# Patient Record
Sex: Male | Born: 1995 | Race: Black or African American | Hispanic: No | Marital: Single | Smoking: Current every day smoker
Health system: Southern US, Community
[De-identification: ages and names within clinical notes are randomized; demographics above are authoritative.]

---

## 2017-02-04 ENCOUNTER — Emergency Department (HOSPITAL_COMMUNITY)
Admission: EM | Admit: 2017-02-04 | Discharge: 2017-02-04 | Disposition: A | Discharge status: Home / Self Care | Payer: BLUE CROSS/BLUE SHIELD | Attending: Emergency Medicine | Admitting: Emergency Medicine

## 2017-02-04 ENCOUNTER — Encounter (HOSPITAL_COMMUNITY): Payer: Self-pay

## 2017-02-04 DIAGNOSIS — R112 Nausea with vomiting, unspecified: Secondary | ICD-10-CM | POA: Diagnosis not present

## 2017-02-04 DIAGNOSIS — F17210 Nicotine dependence, cigarettes, uncomplicated: Secondary | ICD-10-CM | POA: Insufficient documentation

## 2017-02-04 DIAGNOSIS — R748 Abnormal levels of other serum enzymes: Secondary | ICD-10-CM | POA: Diagnosis not present

## 2017-02-04 DIAGNOSIS — R51 Headache: Secondary | ICD-10-CM | POA: Insufficient documentation

## 2017-02-04 DIAGNOSIS — R519 Headache, unspecified: Secondary | ICD-10-CM

## 2017-02-04 LAB — HEPATIC FUNCTION PANEL
ALT: 75 U/L — AB (ref 17–63)
AST: 166 U/L — ABNORMAL HIGH (ref 15–41)
Albumin: 5.4 g/dL — ABNORMAL HIGH (ref 3.5–5.0)
Alkaline Phosphatase: 113 U/L (ref 38–126)
BILIRUBIN DIRECT: 0.2 mg/dL (ref 0.1–0.5)
BILIRUBIN INDIRECT: 1.1 mg/dL — AB (ref 0.3–0.9)
Total Bilirubin: 1.3 mg/dL — ABNORMAL HIGH (ref 0.3–1.2)
Total Protein: 9.1 g/dL — ABNORMAL HIGH (ref 6.5–8.1)

## 2017-02-04 LAB — BASIC METABOLIC PANEL
Anion gap: 15 (ref 5–15)
BUN: 28 mg/dL — AB (ref 6–20)
CALCIUM: 10.1 mg/dL (ref 8.9–10.3)
CO2: 21 mmol/L — AB (ref 22–32)
CREATININE: 1.22 mg/dL (ref 0.61–1.24)
Chloride: 99 mmol/L — ABNORMAL LOW (ref 101–111)
GFR calc Af Amer: >60 mL/min (ref >60)
GFR calc non Af Amer: >60 mL/min (ref >60)
GLUCOSE: 87 mg/dL (ref 65–99)
Potassium: 4.4 mmol/L (ref 3.5–5.1)
Sodium: 135 mmol/L (ref 135–145)

## 2017-02-04 LAB — CBC
HCT: 42.1 % (ref 39.0–52.0)
Hemoglobin: 14.3 g/dL (ref 13.0–17.0)
MCH: 28 pg (ref 26.0–34.0)
MCHC: 34 g/dL (ref 30.0–36.0)
MCV: 82.4 fL (ref 78.0–100.0)
PLATELETS: 214 10*3/uL (ref 150–400)
RBC: 5.11 MIL/uL (ref 4.22–5.81)
RDW: 11.7 % (ref 11.5–15.5)
WBC: 10.3 10*3/uL (ref 4.0–10.5)

## 2017-02-04 LAB — LIPASE, BLOOD: LIPASE: 23 U/L (ref 11–51)

## 2017-02-04 MED ORDER — DIPHENHYDRAMINE HCL 50 MG/ML IJ SOLN
25.0000 mg | Freq: Once | INTRAMUSCULAR | Status: AC
  Administered 2017-02-04: 25 mg via INTRAVENOUS
  Filled 2017-02-04: qty 1

## 2017-02-04 MED ORDER — METOCLOPRAMIDE HCL 5 MG/ML IJ SOLN
10.0000 mg | Freq: Once | INTRAMUSCULAR | Status: AC
  Administered 2017-02-04: 10 mg via INTRAVENOUS
  Filled 2017-02-04: qty 2

## 2017-02-04 MED ORDER — SODIUM CHLORIDE 0.9 % IV BOLUS (SEPSIS)
1000.0000 mL | Freq: Once | INTRAVENOUS | Status: AC
  Administered 2017-02-04: 1000 mL via INTRAVENOUS

## 2017-02-04 MED ORDER — ONDANSETRON 4 MG PO TBDP: 4.0000 mg | ORAL_TABLET | Freq: Three times a day (TID) | ORAL | 0 refills | Status: AC | PRN

## 2017-02-04 NOTE — ED Triage Notes (Signed)
Pt states he has a pain in his temples X2 days. Pt reports last night he had some nausea and vomiting. Pt concerned he could be dehydrated. AOX4, skin warm and dry.

## 2017-02-04 NOTE — ED Provider Notes (Signed)
[facility]-EMERGENCY DEPT Provider Note   CSN: [id number] Arrival date & time: 02/04/17  1016     History   Chief Complaint Chief Complaint  Patient presents with  . Headache    HPI Johnny Gentry is a 21 y.o. male.  Johnny Gentry is a 21 y.o. Male who presents to the emergency department complaining of headache, associated vomiting since yesterday. Patient reports yesterday he began having some vomiting with associated generalized abdominal pain starting yesterday. He reports developing a gradual onset of a headache after and feeling lightheaded with position change. He reports earlier today he was still feeling lightheaded with position change. He tells me he's had nothing to eat or drink today because he feels he'll be nauseated if he eats. He denies any current abdominal pain. He has not vomited today. No diarrhea. He reports generalized frontal headache. He denies any head injury. He does admit to occasional alcohol use and THC use. No anticoagulation use. He denies any treatments at the prior to arrival. He denies fevers, numbness, tingling, weakness, cough, chest pain, shortness of breath, emesis, hematochezia, diarrhea, urinary symptoms or rashes.   The history is provided by the patient and medical records. No language interpreter was used.  Headache   Associated symptoms include nausea and vomiting. Pertinent negatives include no fever and no shortness of breath.    History reviewed. No pertinent past medical history.  There are no active problems to display for this patient.   History reviewed. No pertinent surgical history.     Home Medications    Prior to Admission medications   Medication Sig Start Date End Date Taking? Authorizing Provider  ondansetron (ZOFRAN ODT) 4 MG disintegrating tablet Take 1 tablet (4 mg total) by mouth every 8 (eight) hours as needed for nausea or vomiting. 02/04/17   Everlene Farrier, PA-C    Family History History reviewed. No pertinent  family history.  Social History Social History  Substance Use Topics  . Smoking status: Current Every Day Smoker  . Smokeless tobacco: Never Used     Comment: 1 cigarette per day  . Alcohol use No     Allergies   Amoxicillin   Review of Systems Review of Systems  Constitutional: Negative for chills and fever.  HENT: Negative for congestion and sore throat.   Eyes: Negative for pain and visual disturbance.  Respiratory: Negative for cough and shortness of breath.   Cardiovascular: Negative for chest pain.  Gastrointestinal: Positive for nausea and vomiting. Negative for abdominal pain and diarrhea.  Genitourinary: Negative for dysuria.  Musculoskeletal: Negative for back pain and neck pain.  Skin: Negative for rash.  Neurological: Positive for light-headedness and headaches. Negative for dizziness, syncope, weakness and numbness.     Physical Exam Updated Vital Signs BP 122/81 (BP Location: Right Arm)   Pulse (!) 54   Temp 98 F (36.7 C) (Oral)   Resp 16   SpO2 96%   Physical Exam  Constitutional: He is oriented to person, place, and time. He appears well-developed and well-nourished. No distress.  Nontoxic appearing.  HENT:  Head: Normocephalic and atraumatic.  Mouth/Throat: Oropharynx is clear and moist.  Eyes: Pupils are equal, round, and reactive to light. Conjunctivae and EOM are normal. Right eye exhibits no discharge. Left eye exhibits no discharge.  Neck: Neck supple. No JVD present.  Cardiovascular: Normal rate, regular rhythm, normal heart sounds and intact distal pulses.  Exam reveals no gallop and no friction rub.   No murmur heard. Pulmonary/Chest: Effort  normal and breath sounds normal. No stridor. No respiratory distress. He has no wheezes. He has no rales.  Lungs are clear to ascultation bilaterally. Symmetric chest expansion bilaterally. No increased work of breathing. No rales or rhonchi.    Abdominal: Soft. Bowel sounds are normal. He exhibits no  distension and no mass. There is no tenderness. There is no guarding.  Abdomen is soft and nontender to palpation.  Musculoskeletal: Normal range of motion. He exhibits no edema or tenderness.  Lymphadenopathy:    He has no cervical adenopathy.  Neurological: He is alert and oriented to person, place, and time. No cranial nerve deficit or sensory deficit. He exhibits normal muscle tone. Coordination normal.  Patient is alert and oriented 3. Cranial nerves are intact. Normal gait. Speech is clear and coherent. No pronator drift. EOMs are intact. Strength and sensation is intact in his bilateral upper and lower extremities.  Skin: Skin is warm and dry. Capillary refill takes less than 2 seconds. No rash noted. He is not diaphoretic. No erythema. No pallor.  Psychiatric: He has a normal mood and affect. His behavior is normal.  Nursing note and vitals reviewed.    ED Treatments / Results  Labs (all labs ordered are listed, but only abnormal results are displayed) Labs Reviewed  BASIC METABOLIC PANEL - Abnormal; Notable for the following:       Result Value   Chloride 99 (*)    CO2 21 (*)    BUN 28 (*)    All other components within normal limits  HEPATIC FUNCTION PANEL - Abnormal; Notable for the following:    Total Protein 9.1 (*)    Albumin 5.4 (*)    AST 166 (*)    ALT 75 (*)    Total Bilirubin 1.3 (*)    Indirect Bilirubin 1.1 (*)    All other components within normal limits  CBC  LIPASE, BLOOD    EKG  EKG Interpretation None       Radiology No results found.  Procedures Procedures (including critical care time)  Medications Ordered in ED Medications  sodium chloride 0.9 % bolus 1,000 mL (1,000 mLs Intravenous New Bag/Given 02/04/17 1316)  metoCLOPramide (REGLAN) injection 10 mg (10 mg Intravenous Given 02/04/17 1315)  diphenhydrAMINE (BENADRYL) injection 25 mg (25 mg Intravenous Given 02/04/17 1316)     Initial Impression / Assessment and Plan / ED Course  I  have reviewed the triage vital signs and the nursing notes.  Pertinent labs & imaging results that were available during my care of the patient were reviewed by me and considered in my medical decision making (see chart for details).     This is a 21 y.o. Male who presents to the emergency department complaining of headache, associated vomiting since yesterday. Patient reports yesterday he began having some vomiting with associated generalized abdominal pain starting yesterday. He reports developing a gradual onset of a headache after and feeling lightheaded with position change. He reports earlier today he was still feeling lightheaded with position change. He tells me he's had nothing to eat or drink today because he feels he'll be nauseated if he eats. He denies any current abdominal pain. He has not vomited today. No diarrhea. He reports generalized frontal headache. He denies any head injury. He does admit to occasional alcohol use and THC use. On exam the patient is afebrile and nontoxic appearing. His abdomen is soft and nontender to palpation. Bowel sounds are present. He has no focal neurological deficits.  We'll provide a migraine cocktail and check blood work. BMP reveals a BUN of 28 and a creatinine of 1.22. Patient reports he has been urinating normally. No difficulty urinating. CBC is within normal limits. No leukocytosis. Lipase is within normal limits. Hepatic function panel is remarkable for mildly elevated AST of 166 and an ALT of 75. Patient does admit to occasional alcohol use. I suspect this might be the cause of his mildly elevated liver enzymes. He has no right upper quadrant tenderness to palpation. After Reglan and Benadryl and fluid bolus he is feeling better and has no nausea or vomiting after tolerating by mouth. Headache has resolved. I did advised for him to discontinue alcohol use as well as Tylenol use. I advised he needs to have his liver enzymes rechecked by primary care. I  advised the patient to follow-up with their primary care provider this week. I advised the patient to return to the emergency department with new or worsening symptoms or new concerns. The patient verbalized understanding and agreement with plan.      Final Clinical Impressions(s) / ED Diagnoses   Final diagnoses:  Bad headache  Non-intractable vomiting with nausea, unspecified vomiting type  Elevated liver enzymes    New Prescriptions New Prescriptions   ONDANSETRON (ZOFRAN ODT) 4 MG DISINTEGRATING TABLET    Take 1 tablet (4 mg total) by mouth every 8 (eight) hours as needed for nausea or vomiting.     Everlene Farrier, PA-C 02/04/17 1535    Little, Ambrose Finland, MD 02/06/17 [phone]

## 2017-02-04 NOTE — Discharge Instructions (Signed)
Please avoid alcohol and Tylenol. Please have your liver enzymes rechecked by primary care as we discussed.

## 2017-02-04 NOTE — ED Notes (Signed)
Iv infiltrated . Pt states pain is better he went to sleep iv d/c

## 2017-02-04 NOTE — ED Notes (Signed)
Pt states that he has had h/a since last night and vomited last night, has not vomited today

## 2018-02-23 ENCOUNTER — Emergency Department (HOSPITAL_COMMUNITY)
Admission: EM | Admit: 2018-02-23 | Discharge: 2018-02-23 | Disposition: A | Discharge status: Home / Self Care | Payer: BLUE CROSS/BLUE SHIELD | Attending: Emergency Medicine | Admitting: Emergency Medicine

## 2018-02-23 ENCOUNTER — Other Ambulatory Visit: Payer: Self-pay

## 2018-02-23 DIAGNOSIS — J029 Acute pharyngitis, unspecified: Secondary | ICD-10-CM

## 2018-02-23 DIAGNOSIS — R599 Enlarged lymph nodes, unspecified: Secondary | ICD-10-CM | POA: Insufficient documentation

## 2018-02-23 DIAGNOSIS — F17210 Nicotine dependence, cigarettes, uncomplicated: Secondary | ICD-10-CM | POA: Diagnosis not present

## 2018-02-23 DIAGNOSIS — R591 Generalized enlarged lymph nodes: Secondary | ICD-10-CM

## 2018-02-23 MED ORDER — NAPROXEN 500 MG PO TABS: 500.0000 mg | ORAL_TABLET | Freq: Two times a day (BID) | ORAL | 0 refills | Status: DC

## 2018-02-23 MED ORDER — NAPROXEN 500 MG PO TABS: 500.0000 mg | ORAL_TABLET | Freq: Two times a day (BID) | ORAL | 0 refills | Status: AC

## 2018-02-23 NOTE — ED Provider Notes (Signed)
[name] EMERGENCY DEPARTMENT Provider Note   CSN: [id number] Arrival date & time: 02/23/18  0707     History   Chief Complaint Chief Complaint  Patient presents with  . Otalgia    HPI Johnny Gentry is a 22 y.o. male.  Patient presents with complaint of sore throat and neck swelling starting this morning.  Patient reports radiation of the pain to his right ear and a little bit of pain with swallowing, however he is able to drink water.  No difficulty breathing.  No chest pain or shortness of breath.  No fevers, nausea, vomiting, or diarrhea.  No nasal congestion.  No treatments prior to arrival. The onset of this condition was acute. The course is constant. Alleviating factors: none.       No past medical history on file.  There are no active problems to display for this patient.   No past surgical history on file.      Home Medications    Prior to Admission medications   Medication Sig Start Date End Date Taking? Authorizing Provider  naproxen (NAPROSYN) 500 MG tablet Take 1 tablet (500 mg total) by mouth 2 (two) times daily. 02/23/18   Johnny Crigler, PA-C  ondansetron (ZOFRAN ODT) 4 MG disintegrating tablet Take 1 tablet (4 mg total) by mouth every 8 (eight) hours as needed for nausea or vomiting. 02/04/17   Johnny Farrier, PA-C    Family History No family history on file.  Social History Social History   Tobacco Use  . Smoking status: Current Every Day Smoker  . Smokeless tobacco: Never Used  . Tobacco comment: 1 cigarette per day  Substance Use Topics  . Alcohol use: No  . Drug use: Yes    Types: Marijuana    Comment: daily     Allergies   Amoxicillin and Penicillins   Review of Systems Review of Systems  Constitutional: Negative for chills, fatigue and fever.  HENT: Positive for sore throat and trouble swallowing. Negative for congestion, ear pain, rhinorrhea and sinus pressure.   Eyes: Negative for redness.  Respiratory:  Negative for cough and wheezing.   Gastrointestinal: Negative for abdominal pain, diarrhea, nausea and vomiting.  Genitourinary: Negative for dysuria.  Musculoskeletal: Negative for myalgias and neck stiffness.  Skin: Negative for rash.  Neurological: Negative for headaches.  Hematological: Positive for adenopathy.     Physical Exam Updated Vital Signs BP 129/78 (BP Location: Right Arm)   Pulse 82   Temp 98.4 F (36.9 C) (Oral)   Resp 20   SpO2 99%   Physical Exam  Constitutional: He appears well-developed and well-nourished.  HENT:  Head: Normocephalic and atraumatic.  Right Ear: Tympanic membrane, external ear and ear canal normal.  Left Ear: Tympanic membrane, external ear and ear canal normal.  Nose: Nose normal. No mucosal edema or rhinorrhea.  Mouth/Throat: Uvula is midline and mucous membranes are normal. Mucous membranes are not dry. No trismus in the jaw. No uvula swelling. Posterior oropharyngeal erythema present. No oropharyngeal exudate, posterior oropharyngeal edema or tonsillar abscesses.  Eyes: Conjunctivae are normal. Right eye exhibits no discharge. Left eye exhibits no discharge.  Neck: Normal range of motion. Neck supple.  Cardiovascular: Normal rate, regular rhythm and normal heart sounds.  Pulmonary/Chest: Effort normal and breath sounds normal. No respiratory distress. He has no wheezes. He has no rales.  Abdominal: Soft. There is no tenderness.  Lymphadenopathy:       Head (right side): Tonsillar adenopathy present.  Head (left side): No tonsillar adenopathy present.    He has cervical adenopathy.       Right cervical: Superficial cervical and posterior cervical adenopathy present.       Left cervical: Superficial cervical and posterior cervical adenopathy present.  Neurological: He is alert.  Skin: Skin is warm and dry.  Psychiatric: He has a normal mood and affect.  Nursing note and vitals reviewed.    ED Treatments / Results  Labs (all  labs ordered are listed, but only abnormal results are displayed) Labs Reviewed - No data to display  EKG None  Radiology No results found.  Procedures Procedures (including critical care time)  Medications Ordered in ED Medications - No data to display   Initial Impression / Assessment and Plan / ED Course  I have reviewed the triage vital signs and the nursing notes.  Pertinent labs & imaging results that were available during my care of the patient were reviewed by me and considered in my medical decision making (see chart for details).     Patient seen and examined.   Vital signs reviewed and are as follows: BP 129/78 (BP Location: Right Arm)   Pulse 82   Temp 98.4 F (36.9 C) (Oral)   Resp 20   SpO2 99%   Plan: naproxen, conservative care.   Patient counseled on supportive care for viral URI and s/s to return including worsening symptoms, persistent fever, persistent vomiting, or if they have any other concerns. Urged to see PCP if symptoms persist for more than 3 days. Patient verbalizes understanding and agrees with plan.    Final Clinical Impressions(s) / ED Diagnoses   Final diagnoses:  Lymphadenopathy of head and neck  Sore throat   Patient with symptoms consistent with a viral syndrome.  He has lymphadenopathy causing the pain in his neck and throat.  Vitals are stable, no fever. No signs of dehydration. Lung exam normal, no signs of pneumonia. Supportive therapy indicated with return if symptoms worsen.     ED Discharge Orders         Ordered    naproxen (NAPROSYN) 500 MG tablet  2 times daily,   Status:  Discontinued     02/23/18 0852    naproxen (NAPROSYN) 500 MG tablet  2 times daily     02/23/18 0855           Johnny Crigler, PA-C 02/23/18 1610    Johnny Racer, MD 02/24/18 1039

## 2018-02-23 NOTE — ED Triage Notes (Signed)
Patient to ED c/o R ear pain and submandibular tenderness on R side. Denies fevers/chills, sore throat.

## 2018-02-23 NOTE — Discharge Instructions (Signed)
Please read and follow all provided instructions.  Your diagnoses today include:  1. Lymphadenopathy of head and neck   2. Sore throat    You appear to have an upper respiratory infection (URI). An upper respiratory tract infection, or cold, is a viral infection of the air passages leading to the lungs. It should improve gradually after 5-7 days. You may have a lingering cough that lasts for 2- 4 weeks after the infection.  Tests performed today include:  Vital signs. See below for your results today.   Medications prescribed:   Naproxen - anti-inflammatory pain medication  Do not exceed 500mg  naproxen every 12 hours, take with food  You have been prescribed an anti-inflammatory medication or NSAID. Take with food. Take smallest effective dose for the shortest duration needed for your pain. Stop taking if you experience stomach pain or vomiting.   Take any prescribed medications only as directed. Treatment for your infection is aimed at treating the symptoms. There are no medications, such as antibiotics, that will cure your infection.   Home care instructions:  Follow any educational materials contained in this packet.   Your illness is contagious and can be spread to others, especially during the first 3 or 4 days. It cannot be cured by antibiotics or other medicines. Take basic precautions such as washing your hands often, covering your mouth when you cough or sneeze, and avoiding public places where you could spread your illness to others.   Please continue drinking plenty of fluids.  Use over-the-counter medicines as needed as directed on packaging for symptom relief.  You may also use ibuprofen or tylenol as directed on packaging for pain or fever.  Do not take multiple medicines containing Tylenol or acetaminophen to avoid taking too much of this medication.  Follow-up instructions: Please follow-up with your primary care provider in the next 3 days for further evaluation of your  symptoms if you are not feeling better.   Return instructions:   Please return to the Emergency Department if you experience worsening symptoms.   RETURN IMMEDIATELY IF you develop shortness of breath, confusion or altered mental status, a new rash, become dizzy, faint, or poorly responsive, or are unable to be cared for at home.  Please return if you have persistent vomiting and cannot keep down fluids or develop a fever that is not controlled by tylenol or motrin.    Please return if you have any other emergent concerns.  Additional Information:  Your vital signs today were: BP 129/78 (BP Location: Right Arm)    Pulse 82    Temp 98.4 F (36.9 C) (Oral)    Resp 20    SpO2 99%  If your blood pressure (BP) was elevated above 135/85 this visit, please have this repeated by your doctor within one month. --------------

## 2018-04-22 ENCOUNTER — Emergency Department (HOSPITAL_COMMUNITY): Payer: BLUE CROSS/BLUE SHIELD

## 2018-04-22 ENCOUNTER — Emergency Department (HOSPITAL_COMMUNITY)
Admission: EM | Admit: 2018-04-22 | Discharge: 2018-04-22 | Disposition: A | Discharge status: Home / Self Care | Payer: BLUE CROSS/BLUE SHIELD | Attending: Emergency Medicine | Admitting: Emergency Medicine

## 2018-04-22 ENCOUNTER — Encounter (HOSPITAL_COMMUNITY): Payer: Self-pay | Admitting: Emergency Medicine

## 2018-04-22 DIAGNOSIS — R361 Hematospermia: Secondary | ICD-10-CM | POA: Diagnosis not present

## 2018-04-22 DIAGNOSIS — N451 Epididymitis: Secondary | ICD-10-CM | POA: Diagnosis not present

## 2018-04-22 DIAGNOSIS — F1210 Cannabis abuse, uncomplicated: Secondary | ICD-10-CM | POA: Insufficient documentation

## 2018-04-22 DIAGNOSIS — N5089 Other specified disorders of the male genital organs: Secondary | ICD-10-CM | POA: Diagnosis present

## 2018-04-22 DIAGNOSIS — R300 Dysuria: Secondary | ICD-10-CM | POA: Insufficient documentation

## 2018-04-22 DIAGNOSIS — F17210 Nicotine dependence, cigarettes, uncomplicated: Secondary | ICD-10-CM | POA: Insufficient documentation

## 2018-04-22 LAB — GC/CHLAMYDIA PROBE AMP (~~LOC~~) NOT AT ARMC
CHLAMYDIA, DNA PROBE: POSITIVE — AB
Neisseria Gonorrhea: NEGATIVE

## 2018-04-22 LAB — URINALYSIS, ROUTINE W REFLEX MICROSCOPIC
BILIRUBIN URINE: NEGATIVE
Glucose, UA: NEGATIVE mg/dL
Ketones, ur: 20 mg/dL — AB
Nitrite: NEGATIVE
PROTEIN: 30 mg/dL — AB
Specific Gravity, Urine: 1.025 (ref 1.005–1.030)
pH: 5 (ref 5.0–8.0)

## 2018-04-22 MED ORDER — CEFTRIAXONE SODIUM 250 MG IJ SOLR
250.0000 mg | Freq: Once | INTRAMUSCULAR | Status: AC
  Administered 2018-04-22: 250 mg via INTRAMUSCULAR
  Filled 2018-04-22: qty 250

## 2018-04-22 MED ORDER — DOXYCYCLINE HYCLATE 100 MG PO CAPS: 100.0000 mg | ORAL_CAPSULE | Freq: Two times a day (BID) | ORAL | 0 refills | Status: AC

## 2018-04-22 MED ORDER — STERILE WATER FOR INJECTION IJ SOLN
INTRAMUSCULAR | Status: AC
  Administered 2018-04-22: 10 mL
  Filled 2018-04-22: qty 10

## 2018-04-22 MED ORDER — ACETAMINOPHEN 325 MG PO TABS
650.0000 mg | ORAL_TABLET | Freq: Once | ORAL | Status: AC
  Administered 2018-04-22: 650 mg via ORAL
  Filled 2018-04-22: qty 2

## 2018-04-22 NOTE — ED Notes (Signed)
Refused to provide urine sample at this time.

## 2018-04-22 NOTE — Discharge Instructions (Signed)
I have prescribed antibiotics in order to treat your infection please take 1 tablet twice a day for the next 10 days. If you experience any worsening symptoms, penile discharge, fever, return to the ED

## 2018-04-22 NOTE — ED Triage Notes (Signed)
Pt reports testicle pain and swelling since Monday. Denies urinary symptoms.

## 2018-04-22 NOTE — ED Provider Notes (Addendum)
[name] EMERGENCY DEPARTMENT Provider Note   CSN: [id number] Arrival date & time: 04/22/18  4098     History   Chief Complaint Chief Complaint  Patient presents with  . Groin Swelling    HPI Johnny Gentry is a 22 y.o. male.  22 y./o male with no PMH presents to the ED with a chief complaint of left testicle swelling x 4 days. Patient reports the symptoms began with dysuria four days ago. He then noted blood in his semen this past week.He reports having sexual intercourse with a new partner last weekend. He denies any previous history of STIs. He denies any penile discharge, testicular pain, abdominal pain, fever, or rash.        Home Medications    Prior to Admission medications   Medication Sig Start Date End Date Taking? Authorizing Provider  doxycycline (VIBRAMYCIN) 100 MG capsule Take 1 capsule (100 mg total) by mouth 2 (two) times daily for 10 days. 04/22/18 05/02/18  Claude Manges, PA-C  naproxen (NAPROSYN) 500 MG tablet Take 1 tablet (500 mg total) by mouth 2 (two) times daily. Patient not taking: Reported on 04/22/2018 02/23/18   Renne Crigler, PA-C  ondansetron (ZOFRAN ODT) 4 MG disintegrating tablet Take 1 tablet (4 mg total) by mouth every 8 (eight) hours as needed for nausea or vomiting. Patient not taking: Reported on 04/22/2018 02/04/17   Everlene Farrier, PA-C    Family History No family history on file.  Social History Social History   Tobacco Use  . Smoking status: Current Every Day Smoker  . Smokeless tobacco: Never Used  . Tobacco comment: 1 cigarette per day  Substance Use Topics  . Alcohol use: No  . Drug use: Yes    Types: Marijuana    Comment: daily     Allergies   Amoxicillin and Penicillins   Review of Systems Review of Systems  Constitutional: Negative for chills and fever.  Gastrointestinal: Negative for abdominal pain, diarrhea, nausea and vomiting.  Genitourinary: Positive for dysuria, hematuria and scrotal  swelling. Negative for discharge, flank pain, penile pain and penile swelling.  Musculoskeletal: Negative for back pain.  Skin: Negative for pallor and wound.  Neurological: Negative for headaches.     Physical Exam Updated Vital Signs BP 139/86 (BP Location: Right Arm)   Pulse (!) 102   Temp 98.5 F (36.9 C) (Oral)   Resp 16   SpO2 95%   Physical Exam  Constitutional: He is oriented to person, place, and time. He appears well-developed and well-nourished.  HENT:  Head: Normocephalic and atraumatic.  Neck: Normal range of motion. Neck supple.  Cardiovascular: Normal heart sounds.  Pulmonary/Chest: Breath sounds normal. He has no wheezes.  Abdominal: Soft. A hernia is present.  Genitourinary: Right testis shows no mass, no swelling and no tenderness. Right testis is descended. Left testis shows swelling and tenderness. Circumcised.     Genitourinary Comments: Ttp on left testicular region, more so posterior to the area. No penile discharge, Lymph swelling, or rash noted.  Chaperoned by NT   Neurological: He is alert and oriented to person, place, and time.  Skin: Skin is warm and dry.  Nursing note and vitals reviewed.    ED Treatments / Results  Labs (all labs ordered are listed, but only abnormal results are displayed) Labs Reviewed  URINALYSIS, ROUTINE W REFLEX MICROSCOPIC - Abnormal; Notable for the following components:      Result Value   APPearance HAZY (*)    Hgb  urine dipstick SMALL (*)    Ketones, ur 20 (*)    Protein, ur 30 (*)    Leukocytes, UA MODERATE (*)    WBC, UA >50 (*)    Bacteria, UA FEW (*)    All other components within normal limits  GC/CHLAMYDIA PROBE AMP ([place]) NOT AT [facility]    EKG None  Radiology US Scrotum W/doppler  Result Date: 04/22/2018 CLINICAL DATA:  Left-sided pain and swelling EXAM: SCROTAL ULTRASOUND DOPPLER ULTRASOUND OF THE TESTICLES TECHNIQUE: Complete ultrasound examination of the testicles, epididymis, and other  scrotal structures was performed. Color and spectral Doppler ultrasound were also utilized to evaluate blood flow to the testicles. COMPARISON:  None. FINDINGS: Right testicle Measurements: 5.2 x 2.8 x 3.4 cm. No mass or microlithiasis visualized. There is mild generalized increased vascularity. Left testicle Measurements: 4.5 x 3.1 x 3.1 cm. No mass or microlithiasis visualized. There is mild generalized increased vascularity. Right epididymis:  Normal in size and appearance. Left epididymis: The tail of the epididymis is diffusely edematous and hyperemic. No epididymal mass is evident. Hydrocele: There is a minimal hydrocele on the left. No hydrocele on the right. Varicocele: There is a moderate varicocele on the left with Valsalva maneuver. No varicocele seen on the right. Pulsed Doppler interrogation of both testes demonstrates normal low resistance arterial and venous waveforms bilaterally. No appreciable scrotal wall thickening. IMPRESSION: 1. The tail of the epididymis on the left is diffusely edematous and hyperemic consistent with epididymitis. No mass evident. 2. Each testis shows slight increase in vascularity, suggesting earliest changes of orchitis bilaterally. No intratesticular mass on either side. No testicular torsion on either side. 3.  Minimal left hydrocele. 4.  Varicocele on the left demonstrated with Valsalva maneuver. Electronically Signed   By: Bretta Bang III M.D.   On: 04/22/2018 08:48    Procedures Procedures (including critical care time)  Medications Ordered in ED Medications  acetaminophen (TYLENOL) tablet 650 mg (650 mg Oral Given 04/22/18 0734)  cefTRIAXone (ROCEPHIN) injection 250 mg (250 mg Intramuscular Given 04/22/18 0944)  sterile water (preservative free) injection (10 mLs  Given 04/22/18 0946)     Initial Impression / Assessment and Plan / ED Course  I have reviewed the triage vital signs and the nursing notes.  Pertinent labs & imaging results that were  available during my care of the patient were reviewed by me and considered in my medical decision making (see chart for details).    Patient presents with left groin swelling x 4 days. He reports blood in his semen and a new sexual partner last weekend. There is significant swelling to the left testicle during examination along with tenderness to palpation. Suspicion for will order ultrasound.  Suspicion for testicular torsion patient reports his pain has gradually increased since 4 days ago.  He denies any fever or trauma to his left testicle.GC probed obtained, advised patient on follow up.  UA shows small amount of blood in his urine, moderate amount of leukocytes, Aikey blood cell count greater than 50 and few bacteria.  Ultrasound scrotum with Doppler showed 1. The tail of the epididymis on the left is diffusely edematous and  hyperemic consistent with epididymitis. No mass evident.    2. Each testis shows slight increase in vascularity, suggesting  earliest changes of orchitis bilaterally. No intratesticular mass on  either side. No testicular torsion on either side.    3. Minimal left hydrocele.    4. Varicocele on the left demonstrated with Valsalva  maneuver.     Will treat patient with IM rocephin during ED visit along with a prescription for doxycycline 100 mg twice a day for the next 10 days.  Patient is advised to return if his symptoms worsen, experiences a fever, blood in his urine.  Return precautions provided.  Final Clinical Impressions(s) / ED Diagnoses   Final diagnoses:  Epididymitis    ED Discharge Orders         Ordered    doxycycline (VIBRAMYCIN) 100 MG capsule  2 times daily     04/22/18 0938           Claude Manges, PA-C 04/22/18 1004    [address], [place], PA-C 04/22/18 1005    Blane Ohara, MD 04/24/18 0006

## 2018-04-22 NOTE — ED Notes (Signed)
Patient transported to Ultrasound 

## 2018-04-22 NOTE — ED Notes (Signed)
ED Provider at bedside. 

## 2018-04-22 NOTE — ED Notes (Signed)
Patient verbalizes understanding of discharge instructions. Opportunity for questioning and answers were provided. Armband removed by staff, pt discharged from ED ambulatory.   

## 2018-06-13 ENCOUNTER — Encounter (HOSPITAL_COMMUNITY): Payer: Self-pay | Admitting: *Deleted

## 2018-06-13 ENCOUNTER — Emergency Department (HOSPITAL_COMMUNITY)
Admission: EM | Admit: 2018-06-13 | Discharge: 2018-06-13 | Disposition: A | Discharge status: Home / Self Care | Payer: BLUE CROSS/BLUE SHIELD | Attending: Emergency Medicine | Admitting: Emergency Medicine

## 2018-06-13 DIAGNOSIS — J069 Acute upper respiratory infection, unspecified: Secondary | ICD-10-CM | POA: Insufficient documentation

## 2018-06-13 DIAGNOSIS — F17200 Nicotine dependence, unspecified, uncomplicated: Secondary | ICD-10-CM | POA: Diagnosis not present

## 2018-06-13 DIAGNOSIS — B9789 Other viral agents as the cause of diseases classified elsewhere: Secondary | ICD-10-CM | POA: Diagnosis not present

## 2018-06-13 DIAGNOSIS — R05 Cough: Secondary | ICD-10-CM | POA: Diagnosis present

## 2018-06-13 MED ORDER — GUAIFENESIN ER 1200 MG PO TB12: 1.0000 | ORAL_TABLET | Freq: Two times a day (BID) | ORAL | 1 refills | Status: AC | PRN

## 2018-06-13 MED ORDER — IBUPROFEN 600 MG PO TABS: 600.0000 mg | ORAL_TABLET | Freq: Four times a day (QID) | ORAL | 0 refills | Status: AC | PRN

## 2018-06-13 MED ORDER — BENZONATATE 100 MG PO CAPS: 100.0000 mg | ORAL_CAPSULE | Freq: Three times a day (TID) | ORAL | 0 refills | Status: AC

## 2018-06-13 NOTE — ED Provider Notes (Signed)
[name] EMERGENCY DEPARTMENT Provider Note   CSN: [id number] Arrival date & time: 06/13/18  1210     History   Chief Complaint Chief Complaint  Patient presents with  . URI    HPI Johnny Gentry is a 22 y.o. male with no significant past medical history presents emergency room today for URI symptoms.  Patient reports that yesterday he began having nasal congestion, sinus pressure, rhinorrhea, and a productive cough with sputum.  He notes that he has a sore throat only after coughing.  He denies constant sore throat.  Patient reports he is taken Tylenol Cold and flu for symptoms that improved his cough but he still has nasal congestion which is why presents today.  He reports he works for [employer] and has been around several sick contacts.  Patient also reports body aches, subjective fever and chills.  Patient denies any aggravating or relieving factors.  Patient denies any neck stiffness, rash, odontophagia, inability control secretions, chest pain, shortness of breath or hemoptysis.   HPI  History reviewed. No pertinent past medical history.  There are no active problems to display for this patient.   History reviewed. No pertinent surgical history.      Home Medications    Prior to Admission medications   Medication Sig Start Date End Date Taking? Authorizing Provider  naproxen (NAPROSYN) 500 MG tablet Take 1 tablet (500 mg total) by mouth 2 (two) times daily. Patient not taking: Reported on 04/22/2018 02/23/18   Renne Crigler, PA-C  ondansetron (ZOFRAN ODT) 4 MG disintegrating tablet Take 1 tablet (4 mg total) by mouth every 8 (eight) hours as needed for nausea or vomiting. Patient not taking: Reported on 04/22/2018 02/04/17   Everlene Farrier, PA-C    Family History History reviewed. No pertinent family history.  Social History Social History   Tobacco Use  . Smoking status: Current Every Day Smoker  . Smokeless tobacco: Never Used  . Tobacco  comment: 1 cigarette per day  Substance Use Topics  . Alcohol use: No  . Drug use: Yes    Types: Marijuana    Comment: daily     Allergies   Amoxicillin and Penicillins   Review of Systems Review of Systems  Constitutional: Positive for chills. Negative for fever.  HENT: Positive for congestion, sinus pressure and sinus pain. Negative for drooling, ear discharge, rhinorrhea and voice change.   Eyes: Negative for pain.  Respiratory: Negative for shortness of breath.   Cardiovascular: Negative for chest pain.  Gastrointestinal: Negative for nausea and vomiting.  Genitourinary: Negative for dysuria.  Musculoskeletal: Negative for neck pain and neck stiffness.  Skin: Negative for rash.  Allergic/Immunologic: Negative for immunocompromised state.  Neurological: Negative for syncope and weakness.  All other systems reviewed and are negative.    Physical Exam Updated Vital Signs BP 136/83 (BP Location: Right Arm)   Pulse (!) 55   Temp 98.8 F (37.1 C) (Oral)   Resp 14   Ht 5\' 9"  (1.753 m)   Wt 74.8 kg   SpO2 100%   BMI 24.37 kg/m   Physical Exam Vitals signs and nursing note reviewed.  Constitutional:      Appearance: He is well-developed. He is not diaphoretic.  HENT:     Head: Normocephalic and atraumatic.     Right Ear: Tympanic membrane and external ear normal.     Left Ear: Tympanic membrane and external ear normal.     Nose: Mucosal edema present.  Right Sinus: No maxillary sinus tenderness or frontal sinus tenderness.     Left Sinus: No maxillary sinus tenderness or frontal sinus tenderness.     Mouth/Throat:     Pharynx: Uvula midline.     Tonsils: No tonsillar exudate.     Comments: The patient has normal phonation and is in control of secretions. No stridor.  Midline uvula without edema. Soft palate rises symmetrically.  No tonsillar erythema or exudates. No PTA. Tongue protrusion is normal. No trismus. No creptius on neck palpation and patient has good  dentition. No gingival erythema or fluctuance noted. Mucus membranes moist.  Eyes:     General: No scleral icterus.       Right eye: No discharge.        Left eye: No discharge.     Pupils: Pupils are equal, round, and reactive to light.  Neck:     Musculoskeletal: Neck supple. Normal range of motion. No neck rigidity or spinous process tenderness.     Trachea: Trachea normal.     Comments: No nuchal rigidity or meningismus Cardiovascular:     Rate and Rhythm: Normal rate and regular rhythm.     Pulses:          Radial pulses are 2+ on the right side and 2+ on the left side.       Dorsalis pedis pulses are 2+ on the right side and 2+ on the left side.       Posterior tibial pulses are 2+ on the right side and 2+ on the left side.     Heart sounds: No murmur.     Comments: No lower extremity swelling or edema. Calves symmetric in size bilaterally. Pulmonary:     Effort: Pulmonary effort is normal.     Breath sounds: Normal breath sounds.     Comments: No increased work of breathing. No accessory muscle use. Patient is sitting upright, speaking in full sentences without difficulty  Chest:     Chest wall: No tenderness.  Abdominal:     General: Bowel sounds are normal.     Palpations: Abdomen is soft.     Tenderness: There is no abdominal tenderness. There is no guarding or rebound.  Lymphadenopathy:     Cervical: No cervical adenopathy.  Skin:    General: Skin is warm and dry.     Findings: No rash.  Neurological:     Mental Status: He is alert.      ED Treatments / Results  Labs (all labs ordered are listed, but only abnormal results are displayed) Labs Reviewed - No data to display  EKG None  Radiology No results found.  Procedures Procedures (including critical care time)  Medications Ordered in ED Medications - No data to display   Initial Impression / Assessment and Plan / ED Course  I have reviewed the triage vital signs and the nursing  notes.  Pertinent labs & imaging results that were available during my care of the patient were reviewed by me and considered in my medical decision making (see chart for details).     22 y.o. male presenting for subjective fever, chills, body aches, nasal congestion, rhinorrhea, sore throat after coughing, and productive cough.  Patient vital signs are reassuring here.  Exam is reassuring as above.  No evidence of mastoiditis or meningeal signs.  Patient's oropharyngeal exam is benign.  There is no tonsillar erythema or exudates.  He was offered a strep test but denies at this time.  Low suspicion for strep based on exam. No evidence of PTA or RPA.  Lungs are clear to auscultation bilaterally.  No indication for chest x-ray at this time.  Suspect viral illness.  Will treat with conservative therapies. I advised the patient to follow-up with pcp this week. Specific return precautions discussed. Time was given for all questions to be answered. The patient verbalized understanding and agreement with plan. The patient appears safe for discharge home.   Final Clinical Impressions(s) / ED Diagnoses   Final diagnoses:  Viral URI with cough    ED Discharge Orders         Ordered    benzonatate (TESSALON) 100 MG capsule  Every 8 hours     06/13/18 1253    Guaifenesin (MUCINEX MAXIMUM STRENGTH) 1200 MG TB12  Every 12 hours PRN     06/13/18 1253    ibuprofen (ADVIL,MOTRIN) 600 MG tablet  Every 6 hours PRN     06/13/18 1253           Princella PellegriniMaczis, Ilani Otterson M, PA-C 06/13/18 1254    Loren RacerYelverton, David, MD 06/14/18 [phone]

## 2018-06-13 NOTE — Discharge Instructions (Signed)
Please read and follow all provided instructions.  Your diagnoses today include:  1. Viral URI with cough     Tests performed today include: Vital signs. See below for your results today.  You were offered a strep test in the department.  Medications prescribed/advised:  1. Mucinex [Guaifenesin] as a decongestant [thin mucus - you have to be well hydrated when taking this for it to work],  2. Tylenol for fever/pain and Motrin/Ibuprofen for muscle aches 3. Cough Suppressant: Tessalon - take as directed.   Home care instructions:  An upper respiratory infection (URI) is also sometimes known as the common cold. Most people improve within 1 week, but symptoms can last up to 2 weeks. A residual cough may last even longer.   URI is most commonly caused by a virus. Viruses are NOT treated with antibiotics. You can easily spread the virus to others by oral contact. This includes kissing, sharing a glass, coughing, or sneezing. Touching your mouth or nose and then touching a surface, which is then touched by another person, can also spread the virus.   TREATMENT  Treatment is directed at relieving symptoms. There is no cure. Antibiotics are not effective, because the infection is caused by a virus, not by bacteria. Treatment may include:  Increased fluid intake. Sports drinks offer valuable electrolytes, sugars, and fluids.  Breathing heated mist or steam (vaporizer or shower).  Eating chicken soup or other clear broths, and maintaining good nutrition.  Getting plenty of rest.  Using gargles or lozenges for comfort.  Controlling fevers with ibuprofen or acetaminophen as directed by your caregiver.  Increasing usage of your inhaler if you have asthma.  Return to work when your temperature has returned to normal.   Follow-up instructions: Followup with your primary care doctor in 4 days if your symptoms persist.  Your more than welcome to return to the emergency department if symptoms worsen or  become concerning.  Return instructions:  Please return to the Emergency Department if you do not get better, if you get worse, or new symptoms OR  - Fever (temperature greater than 101.54F)  - Bleeding that does not stop with holding pressure to the area    -Severe pain (please note that you may be more sore the day after your accident)  - Chest Pain  - Difficulty breathing (worsening shortness of breath with sputum production may be a sign of pneumonia.   - Severe nausea or vomiting  - Inability to tolerate food and liquids  - Passing out  - Skin becoming red around your wounds  - Change in mental status (confusion or lethargy)  - New numbness or weakness     -You develop fever, swollen neck glands, pain with swallowing or Lapier areas on  the back of your throat. This may be a sign of strep throat.  Please return if you have any other emergent concerns.  Additional Information:  Your vital signs today were: BP 136/83 (BP Location: Right Arm)    Pulse (!) 55    Temp 98.8 F (37.1 C) (Oral)    Resp 14    Ht 5\' 9"  (1.753 m)    Wt 74.8 kg    SpO2 100%    BMI 24.37 kg/m  If your blood pressure (BP) was elevated above 135/85 this visit, please have this repeated by your doctor within one month.

## 2018-06-13 NOTE — ED Triage Notes (Signed)
Pt in c/o mild cough and nasal congestion that started yesterday, said he took tylenol cold and flu and it improved his cough but he is still congested

## 2018-12-22 ENCOUNTER — Emergency Department (HOSPITAL_COMMUNITY): Payer: Managed Care, Other (non HMO)

## 2018-12-22 ENCOUNTER — Encounter (HOSPITAL_COMMUNITY): Payer: Self-pay | Admitting: *Deleted

## 2018-12-22 ENCOUNTER — Other Ambulatory Visit: Payer: Self-pay

## 2018-12-22 ENCOUNTER — Emergency Department (HOSPITAL_COMMUNITY)
Admission: EM | Admit: 2018-12-22 | Discharge: 2018-12-22 | Disposition: A | Discharge status: Home / Self Care | Payer: Managed Care, Other (non HMO) | Attending: Emergency Medicine | Admitting: Emergency Medicine

## 2018-12-22 DIAGNOSIS — F17210 Nicotine dependence, cigarettes, uncomplicated: Secondary | ICD-10-CM | POA: Diagnosis not present

## 2018-12-22 DIAGNOSIS — M79604 Pain in right leg: Secondary | ICD-10-CM | POA: Insufficient documentation

## 2018-12-22 MED ORDER — ACETAMINOPHEN 325 MG PO TABS
650.0000 mg | ORAL_TABLET | Freq: Once | ORAL | Status: AC
  Administered 2018-12-22: 650 mg via ORAL
  Filled 2018-12-22: qty 2

## 2018-12-22 NOTE — ED Notes (Signed)
Pt transported to xray 

## 2018-12-22 NOTE — ED Provider Notes (Signed)
[name]   CSN: [id number] Arrival date & time: 12/22/18  2029    History   Chief Complaint Chief Complaint  Patient presents with  . Leg Pain    HPI Johnny Gentry is a 23 y.o. male with no significant past medical history who presents to the emergency department for right leg pain. Patient reports that he was doing a "butterfly" kick while he was at work yesterday. He reports that he landed wrong on his right leg and "felt my hamstring pop". He is able to ambulate but states that this worsens his pain. He denies any numbness or tingling in his right lower extremity. No medications taken today prior to arrival. He denies any other injuries. He also denies fever, symptoms of illness, or known sick contacts.      The history is provided by the patient. No language interpreter was used.    History reviewed. No pertinent past medical history.  There are no active problems to display for this patient.   History reviewed. No pertinent surgical history.      Home Medications    Prior to Admission medications   Medication Sig Start Date End Date Taking? Authorizing Provider  benzonatate (TESSALON) 100 MG capsule Take 1 capsule (100 mg total) by mouth every 8 (eight) hours. 06/13/18   Maczis, Elmer SowMichael M, PA-C  Guaifenesin (MUCINEX MAXIMUM STRENGTH) 1200 MG TB12 Take 1 tablet (1,200 mg total) by mouth every 12 (twelve) hours as needed. 06/13/18   Maczis, Elmer SowMichael M, PA-C  ibuprofen (ADVIL,MOTRIN) 600 MG tablet Take 1 tablet (600 mg total) by mouth every 6 (six) hours as needed. 06/13/18   Maczis, Elmer SowMichael M, PA-C  naproxen (NAPROSYN) 500 MG tablet Take 1 tablet (500 mg total) by mouth 2 (two) times daily. Patient not taking: Reported on 04/22/2018 02/23/18   Renne CriglerGeiple, Joshua, PA-C  ondansetron (ZOFRAN ODT) 4 MG disintegrating tablet Take 1 tablet (4 mg total) by mouth every 8 (eight) hours as needed for nausea or vomiting. Patient not  taking: Reported on 04/22/2018 02/04/17   Everlene Farrieransie, William, PA-C    Family History No family history on file.  Social History Social History   Tobacco Use  . Smoking status: Current Every Day Smoker  . Smokeless tobacco: Never Used  . Tobacco comment: 1 cigarette per day  Substance Use Topics  . Alcohol use: No  . Drug use: Yes    Types: Marijuana    Comment: daily     Allergies   Amoxicillin and Penicillins   Review of Systems Review of Systems  Musculoskeletal: Positive for gait problem (Right leg pain s/p injury.).  All other systems reviewed and are negative.    Physical Exam Updated Vital Signs BP 130/83 (BP Location: Right Arm)   Pulse (!) 56   Temp 98.4 F (36.9 C) (Oral)   Resp 16   SpO2 100%   Physical Exam Vitals signs and nursing note reviewed.  Constitutional:      General: He is not in acute distress.    Appearance: Normal appearance. He is well-developed. He is not toxic-appearing.  HENT:     Head: Normocephalic and atraumatic.     Right Ear: External ear normal.     Left Ear: External ear normal.     Nose: Nose normal.     Mouth/Throat:     Mouth: Mucous membranes are moist.     Pharynx: Oropharynx is clear. Uvula midline.  Eyes:  General: Lids are normal. No scleral icterus.    Conjunctiva/sclera: Conjunctivae normal.     Pupils: Pupils are equal, round, and reactive to light.  Neck:     Musculoskeletal: Full passive range of motion without pain, normal range of motion and neck supple.  Cardiovascular:     Rate and Rhythm: Normal rate.     Pulses: Normal pulses.     Heart sounds: Normal heart sounds. No murmur.  Pulmonary:     Effort: Pulmonary effort is normal.     Breath sounds: Normal breath sounds.  Abdominal:     General: Abdomen is flat. Bowel sounds are normal.     Palpations: Abdomen is soft.     Tenderness: There is no abdominal tenderness.  Musculoskeletal: Normal range of motion.     Right hip: Normal.     Right knee:  Normal.     Right upper leg: He exhibits tenderness.       Legs:     Comments: Right pedal pulse 2+. CR in right foot is 2 seconds x5.   Skin:    General: Skin is warm and dry.     Capillary Refill: Capillary refill takes less than 2 seconds.  Neurological:     Mental Status: He is alert and oriented to person, place, and time.      ED Treatments / Results  Labs (all labs ordered are listed, but only abnormal results are displayed) Labs Reviewed - No data to display  EKG None  Radiology Dg Femur Min 2 Views Right  Result Date: 12/22/2018 CLINICAL DATA:  Leg pain EXAM: RIGHT FEMUR 2 VIEWS COMPARISON:  None. FINDINGS: There is no evidence of fracture or other focal bone lesions. Soft tissues are unremarkable. IMPRESSION: Negative. Electronically Signed   By: Donavan Foil M.D.   On: 12/22/2018 22:38    Procedures Procedures (including critical care time)  Medications Ordered in ED Medications  acetaminophen (TYLENOL) tablet 650 mg (650 mg Oral Given 12/22/18 2206)     Initial Impression / Assessment and Plan / ED Course  I have reviewed the triage vital signs and the nursing notes.  Pertinent labs & imaging results that were available during my care of the patient were reviewed by me and considered in my medical decision making (see chart for details).        23yo male with injury to right leg that occurred yesterday after doing a "butterfly kick" at work. On exam, he is well appearing and in NAD. VSS. Right upper posterior leg with ttp. Right hip and right knee with normal exam. No swelling or deformities. He is NVI distal to his injury. Tylenol given for pain. Suspect muscular injury but will obtain x-ray to r/o fracture.  X-ray of the right femur is negative. Will recommend RICE therapy and PCP f/u as needed. Due to patient endorsing pain with ambulation, crutches provided by ortho tech. Patient was discharged home stable and in good condition.   Discussed supportive  care as well as need for f/u w/ PCP in the next 1-2 days.  Also discussed sx that warrant sooner re-evaluation in emergency department. Family / patient/ caregiver informed of clinical course, understand medical decision-making process, and agree with plan.  Final Clinical Impressions(s) / ED Diagnoses   Final diagnoses:  Right leg pain    ED Discharge Orders    None       Jean Rosenthal, NP 12/22/18 2316    Louanne Skye, MD 12/24/18 [phone]

## 2018-12-22 NOTE — ED Triage Notes (Signed)
Pt reports he was doing a butterfly kick at work yesterday and felt a pop in his hamstring. Pain since then.

## 2018-12-22 NOTE — ED Notes (Signed)
Ortho tech at bedside 

## 2019-05-02 ENCOUNTER — Emergency Department (HOSPITAL_COMMUNITY)
Admission: EM | Admit: 2019-05-02 | Discharge: 2019-05-02 | Discharge status: Against Medical Advice | Payer: Federal, State, Local not specified - PPO | Attending: Emergency Medicine | Admitting: Emergency Medicine

## 2019-05-02 ENCOUNTER — Encounter (HOSPITAL_COMMUNITY): Payer: Self-pay | Admitting: Emergency Medicine

## 2019-05-02 ENCOUNTER — Other Ambulatory Visit: Payer: Self-pay

## 2019-05-02 DIAGNOSIS — Z5321 Procedure and treatment not carried out due to patient leaving prior to being seen by health care provider: Secondary | ICD-10-CM | POA: Diagnosis not present

## 2019-05-02 DIAGNOSIS — R1011 Right upper quadrant pain: Secondary | ICD-10-CM | POA: Diagnosis present

## 2019-05-02 LAB — COMPREHENSIVE METABOLIC PANEL
ALT: 25 U/L (ref 0–44)
AST: 29 U/L (ref 15–41)
Albumin: 4.2 g/dL (ref 3.5–5.0)
Alkaline Phosphatase: 92 U/L (ref 38–126)
Anion gap: 9 (ref 5–15)
BUN: 6 mg/dL (ref 6–20)
CO2: 23 mmol/L (ref 22–32)
Calcium: 9.1 mg/dL (ref 8.9–10.3)
Chloride: 106 mmol/L (ref 98–111)
Creatinine, Ser: 0.91 mg/dL (ref 0.61–1.24)
GFR calc Af Amer: >60 mL/min (ref >60)
GFR calc non Af Amer: >60 mL/min (ref >60)
Glucose, Bld: 90 mg/dL (ref 70–99)
Potassium: 4.1 mmol/L (ref 3.5–5.1)
Sodium: 138 mmol/L (ref 135–145)
Total Bilirubin: 0.6 mg/dL (ref 0.3–1.2)
Total Protein: 6.8 g/dL (ref 6.5–8.1)

## 2019-05-02 LAB — LIPASE, BLOOD: Lipase: 21 U/L (ref 11–51)

## 2019-05-02 LAB — URINALYSIS, ROUTINE W REFLEX MICROSCOPIC
Bilirubin Urine: NEGATIVE
Glucose, UA: NEGATIVE mg/dL
Hgb urine dipstick: NEGATIVE
Ketones, ur: NEGATIVE mg/dL
Leukocytes,Ua: NEGATIVE
Nitrite: NEGATIVE
Protein, ur: NEGATIVE mg/dL
Specific Gravity, Urine: 1.008 (ref 1.005–1.030)
pH: 6 (ref 5.0–8.0)

## 2019-05-02 LAB — CBC
HCT: 39.6 % (ref 39.0–52.0)
Hemoglobin: 12.5 g/dL — ABNORMAL LOW (ref 13.0–17.0)
MCH: 28.7 pg (ref 26.0–34.0)
MCHC: 31.6 g/dL (ref 30.0–36.0)
MCV: 90.8 fL (ref 80.0–100.0)
Platelets: 144 10*3/uL — ABNORMAL LOW (ref 150–400)
RBC: 4.36 MIL/uL (ref 4.22–5.81)
RDW: 11.5 % (ref 11.5–15.5)
WBC: 4.3 10*3/uL (ref 4.0–10.5)
nRBC: 0 % (ref 0.0–0.2)

## 2019-05-02 MED ORDER — SODIUM CHLORIDE 0.9% FLUSH: 3.0000 mL | Freq: Once | INTRAVENOUS | Status: DC

## 2019-05-02 NOTE — ED Notes (Signed)
No answer for ready room 

## 2019-05-02 NOTE — ED Notes (Signed)
Called to update vital no answer °

## 2019-05-02 NOTE — ED Triage Notes (Signed)
Pt in with RLQ abdominal pain x 1 wk started 5 days ago. States the pain got worse when he drank ETOH 2 days ago. C/o nausea and fatigue. Denies any sick contacts. "Wants to be tested for hepatitis"

## 2019-05-02 NOTE — ED Notes (Signed)
Pt did not respond when called for vitals check, pt  Not seen waiting outside either

## 2019-05-02 NOTE — ED Triage Notes (Signed)
Called x1 for triage. No answer

## 2021-02-08 IMAGING — CR RIGHT FEMUR 2 VIEWS
4 series · 4 of 4 positions shown · non-contrast
Comparison: None.

CLINICAL DATA: Leg pain

EXAM:
RIGHT FEMUR 2 VIEWS

[femur ap (1 of 2)]
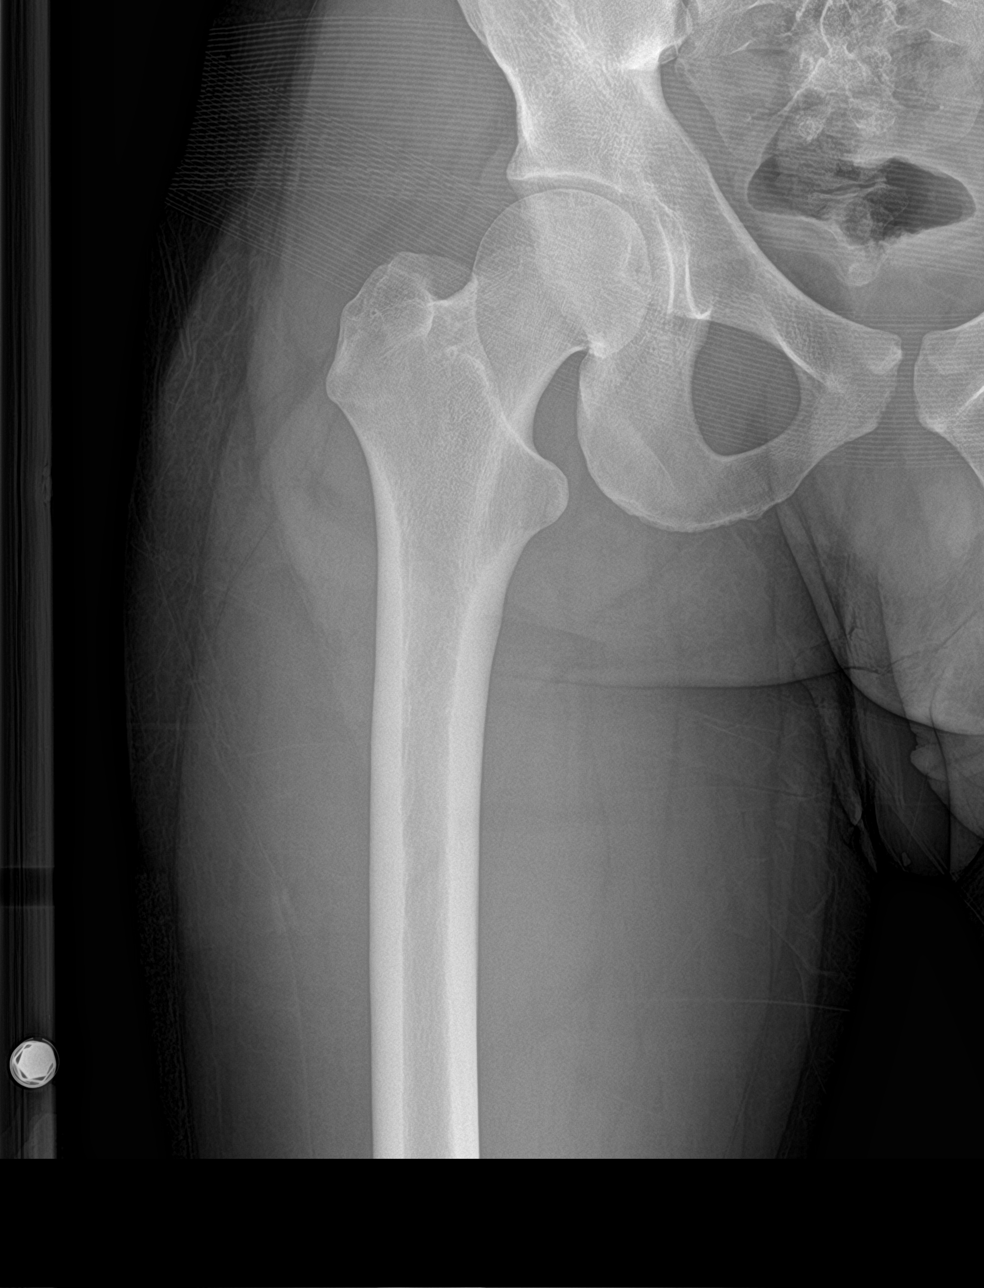

[femur ap (2 of 2)]
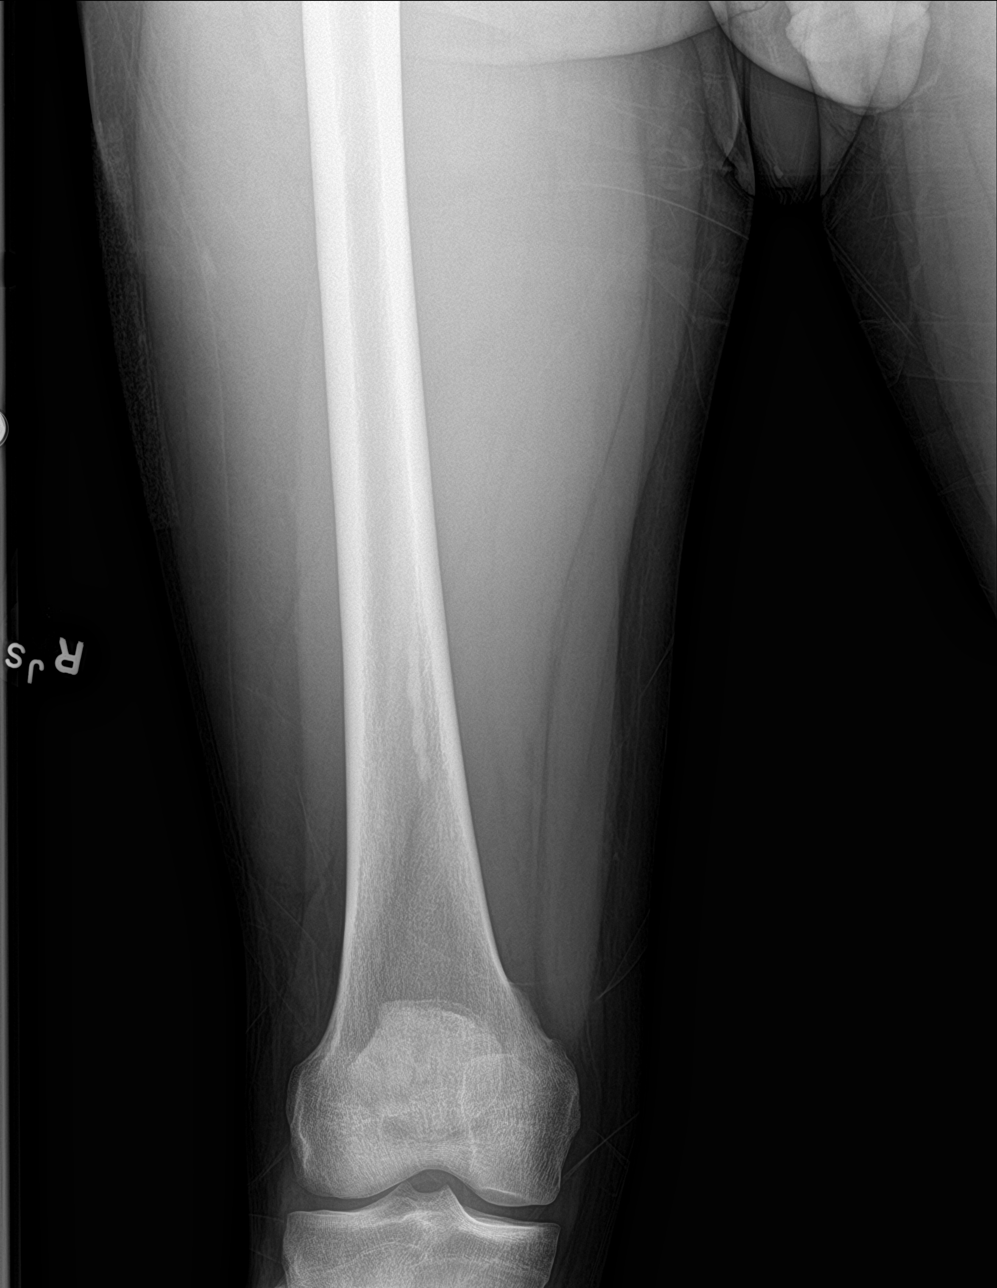

[femur lat (1 of 2)]
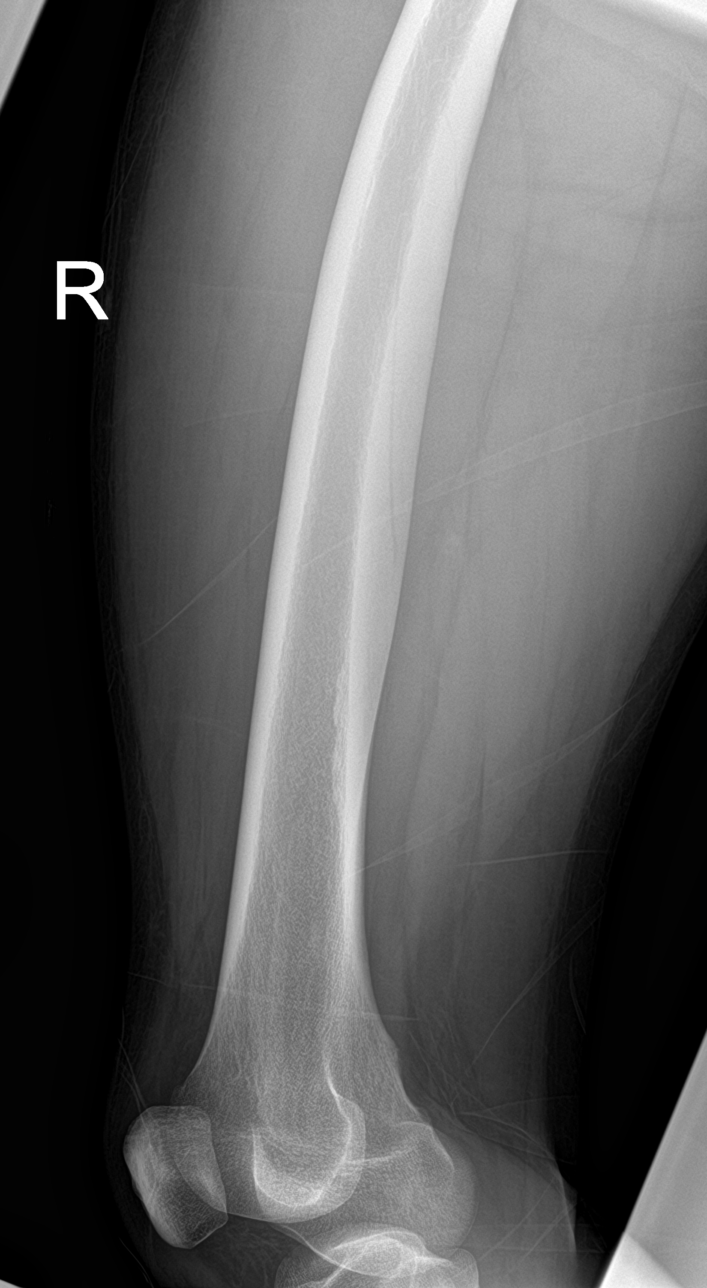

[femur lat (2 of 2)]
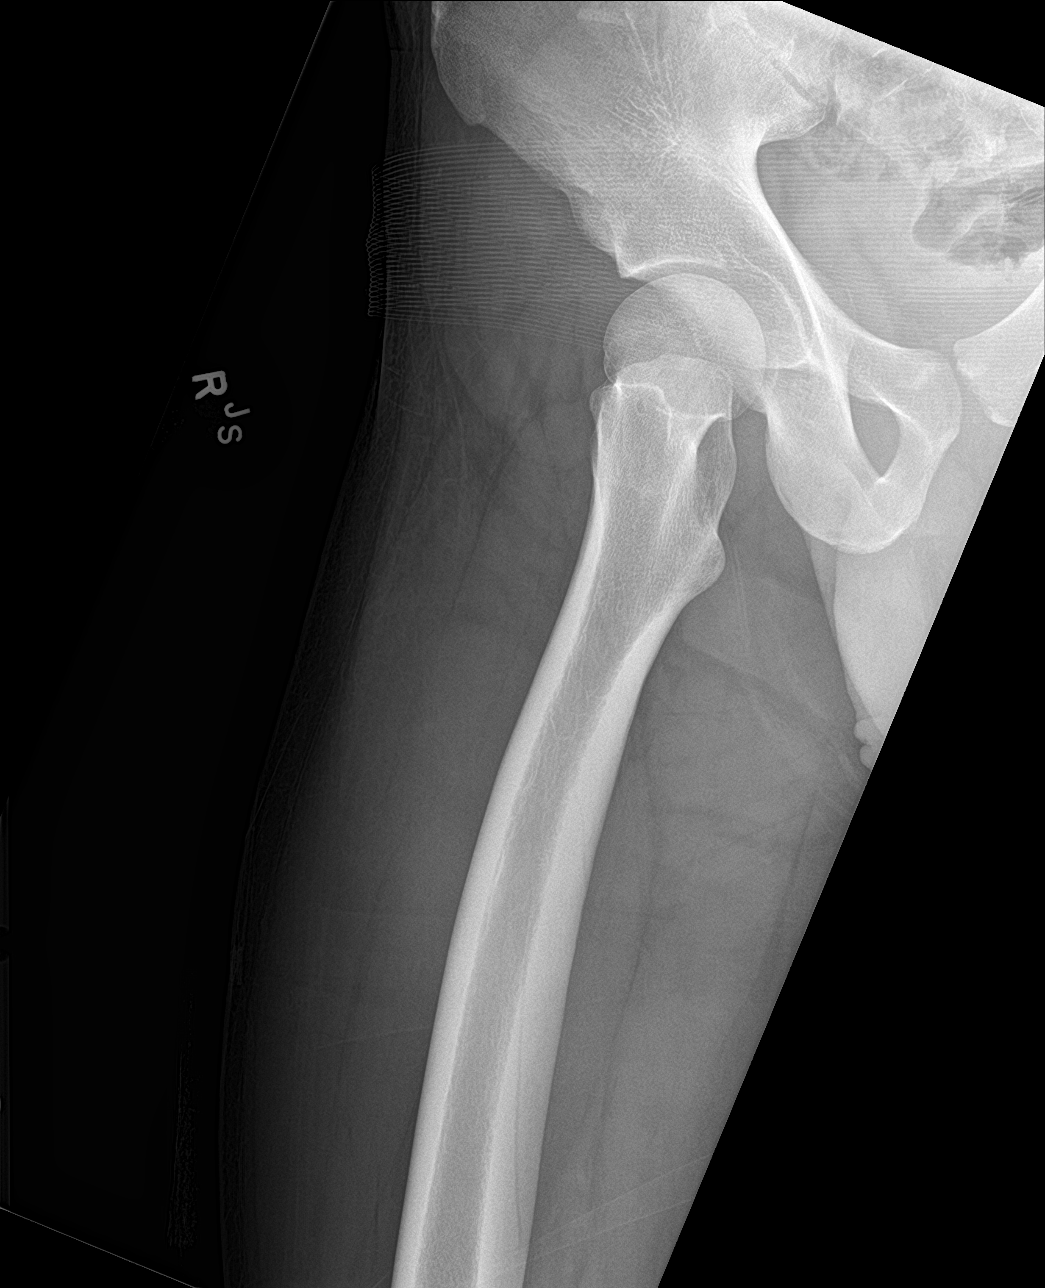

[4 of 4 positions shown; findings below may reference images not displayed]

FINDINGS: There is no evidence of fracture or other focal bone lesions. Soft
tissues are unremarkable.
IMPRESSION: Negative.

## 2021-06-13 DIAGNOSIS — B349 Viral infection, unspecified: Secondary | ICD-10-CM | POA: Diagnosis not present

## 2021-06-13 DIAGNOSIS — Z20822 Contact with and (suspected) exposure to covid-19: Secondary | ICD-10-CM | POA: Diagnosis not present

## 2023-05-11 ENCOUNTER — Ambulatory Visit
Admission: EM | Admit: 2023-05-11 | Discharge: 2023-05-11 | Disposition: A | Discharge status: Home / Self Care | Payer: Federal, State, Local not specified - PPO | Attending: Internal Medicine | Admitting: Internal Medicine

## 2023-05-11 DIAGNOSIS — B9789 Other viral agents as the cause of diseases classified elsewhere: Secondary | ICD-10-CM

## 2023-05-11 DIAGNOSIS — J988 Other specified respiratory disorders: Secondary | ICD-10-CM

## 2023-05-11 DIAGNOSIS — F17200 Nicotine dependence, unspecified, uncomplicated: Secondary | ICD-10-CM

## 2023-05-11 MED ORDER — CETIRIZINE HCL 10 MG PO TABS: 10.0000 mg | ORAL_TABLET | Freq: Every day | ORAL | 0 refills | Status: AC

## 2023-05-11 MED ORDER — PROMETHAZINE-DM 6.25-15 MG/5ML PO SYRP: 5.0000 mL | ORAL_SOLUTION | Freq: Three times a day (TID) | ORAL | 0 refills | Status: AC | PRN

## 2023-05-11 MED ORDER — ACETAMINOPHEN 325 MG PO TABS: 650.0000 mg | ORAL_TABLET | Freq: Four times a day (QID) | ORAL | 0 refills | Status: AC | PRN

## 2023-05-11 MED ORDER — PREDNISONE 10 MG PO TABS: 30.0000 mg | ORAL_TABLET | Freq: Every day | ORAL | 0 refills | Status: AC

## 2023-05-11 NOTE — ED Triage Notes (Signed)
Pt reports nasal congestion and phlegm x 1 day. Pt states today is worse. Pt has not  taken any meds for complaints.

## 2023-05-11 NOTE — ED Provider Notes (Signed)
[facility]  Note:  This document was prepared using Conservation officer, historic buildings and may include unintentional dictation errors.  MRN: [id number] DOB: [birth year]  Subjective:   Johnny Gentry is a 27 y.o. male presenting for 1 day history of productive cough, sinus congestion, malaise and fatigue, shortness of breath.  No fever, chest pain, wheezing.  No asthma. Smokes marijuana, vapes daily, smokes cigarettes occasionally.   No current facility-administered medications for this encounter.  Current Outpatient Medications:    benzonatate (TESSALON) 100 MG capsule, Take 1 capsule (100 mg total) by mouth every 8 (eight) hours., Disp: 21 capsule, Rfl: 0   Guaifenesin (MUCINEX MAXIMUM STRENGTH) 1200 MG TB12, Take 1 tablet (1,200 mg total) by mouth every 12 (twelve) hours as needed., Disp: 14 tablet, Rfl: 1   ibuprofen (ADVIL,MOTRIN) 600 MG tablet, Take 1 tablet (600 mg total) by mouth every 6 (six) hours as needed., Disp: 30 tablet, Rfl: 0   naproxen (NAPROSYN) 500 MG tablet, Take 1 tablet (500 mg total) by mouth 2 (two) times daily. (Patient not taking: Reported on 04/22/2018), Disp: 20 tablet, Rfl: 0   ondansetron (ZOFRAN ODT) 4 MG disintegrating tablet, Take 1 tablet (4 mg total) by mouth every 8 (eight) hours as needed for nausea or vomiting. (Patient not taking: Reported on 04/22/2018), Disp: 10 tablet, Rfl: 0   Allergies  Allergen Reactions   Amoxicillin Rash   Penicillins Rash    History reviewed. No pertinent past medical history.   History reviewed. No pertinent surgical history.  History reviewed. No pertinent family history.  Social History   Tobacco Use   Smoking status: Every Day   Smokeless tobacco: Never   Tobacco comments:    1 cigarette per day  Vaping Use   Vaping status: Some Days  Substance Use Topics   Alcohol use: Yes    Comment: Socially   Drug use: Yes    Types: Marijuana    Comment: daily    ROS   Objective:    Vitals: BP 120/74 (BP Location: Right Arm)   Pulse 71   Temp 97.8 F (36.6 C) (Oral)   Resp 18   SpO2 96%   Physical Exam Constitutional:      General: He is not in acute distress.    Appearance: Normal appearance. He is well-developed and normal weight. He is not ill-appearing, toxic-appearing or diaphoretic.  HENT:     Head: Normocephalic and atraumatic.     Right Ear: Tympanic membrane, ear canal and external ear normal. No drainage, swelling or tenderness. No middle ear effusion. There is no impacted cerumen. Tympanic membrane is not erythematous or bulging.     Left Ear: Tympanic membrane, ear canal and external ear normal. No drainage, swelling or tenderness.  No middle ear effusion. There is no impacted cerumen. Tympanic membrane is not erythematous or bulging.     Nose: Congestion present. No rhinorrhea.     Mouth/Throat:     Mouth: Mucous membranes are moist.     Pharynx: No oropharyngeal exudate or posterior oropharyngeal erythema.     Comments: Thick postnasal drainage overlying pharynx. Eyes:     General: No scleral icterus.       Right eye: No discharge.        Left eye: No discharge.     Extraocular Movements: Extraocular movements intact.     Conjunctiva/sclera: Conjunctivae normal.  Cardiovascular:     Rate and Rhythm: Normal rate and regular rhythm.  Heart sounds: Normal heart sounds. No murmur heard.    No friction rub. No gallop.  Pulmonary:     Effort: Pulmonary effort is normal. No respiratory distress.     Breath sounds: Normal breath sounds. No stridor. No wheezing, rhonchi or rales.  Musculoskeletal:     Cervical back: Normal range of motion and neck supple. No rigidity. No muscular tenderness.  Neurological:     General: No focal deficit present.     Mental Status: He is alert and oriented to person, place, and time.  Psychiatric:        Mood and Affect: Mood normal.        Behavior: Behavior normal.        Thought Content: Thought content  normal.        Judgment: Judgment normal.     Assessment and Plan :   PDMP not reviewed this encounter.  1. Viral respiratory infection   2. Smoker    In the context of his smoking, respiratory symptoms I recommended an oral prednisone course of 30 mg for 5 days.  Use supportive care otherwise for a viral respiratory infection.  Counseled patient on potential for adverse effects with medications prescribed/recommended today, ER and return-to-clinic precautions discussed, patient verbalized understanding.    Wallis Bamberg, PA-C 05/11/23 1428

## 2023-05-11 NOTE — Discharge Instructions (Addendum)
We will manage this as a viral upper respiratory infection. For sore throat or cough try using a honey-based tea. Use 3 teaspoons of honey with juice squeezed from half lemon. Place shaved pieces of ginger into 1/2-1 cup of water and warm over stove top. Then mix the ingredients and repeat every 4 hours as needed. Please take Tylenol 500mg -650mg  once every 6 hours for fevers, aches and pains. Hydrate very well with at least 2 liters (64 ounces) of water. Eat light meals such as soups (chicken and noodles, chicken wild rice, vegetable).  Do not eat any foods that you are allergic to.  Start an antihistamine like Zyrtec (10mg  daily) for postnasal drainage, sinus congestion.  You can take this together with prednisone. Use cough syrup as needed.

## 2023-07-03 ENCOUNTER — Emergency Department (HOSPITAL_COMMUNITY)
Admission: EM | Admit: 2023-07-03 | Discharge: 2023-07-03 | Disposition: A | Discharge status: Home / Self Care | Payer: Self-pay | Attending: Emergency Medicine | Admitting: Emergency Medicine

## 2023-07-03 ENCOUNTER — Other Ambulatory Visit: Payer: Self-pay

## 2023-07-03 ENCOUNTER — Encounter (HOSPITAL_COMMUNITY): Payer: Self-pay | Admitting: *Deleted

## 2023-07-03 ENCOUNTER — Emergency Department (HOSPITAL_COMMUNITY): Payer: Self-pay

## 2023-07-03 DIAGNOSIS — M25561 Pain in right knee: Secondary | ICD-10-CM

## 2023-07-03 DIAGNOSIS — W010XXA Fall on same level from slipping, tripping and stumbling without subsequent striking against object, initial encounter: Secondary | ICD-10-CM | POA: Insufficient documentation

## 2023-07-03 DIAGNOSIS — Y990 Civilian activity done for income or pay: Secondary | ICD-10-CM | POA: Insufficient documentation

## 2023-07-03 DIAGNOSIS — S8991XA Unspecified injury of right lower leg, initial encounter: Secondary | ICD-10-CM | POA: Insufficient documentation

## 2023-07-03 MED ORDER — IBUPROFEN 400 MG PO TABS
600.0000 mg | ORAL_TABLET | Freq: Once | ORAL | Status: AC
  Administered 2023-07-03: 600 mg via ORAL
  Filled 2023-07-03: qty 1

## 2023-07-03 NOTE — ED Provider Notes (Signed)
 [facility] Provider Note   CSN: [id number] Arrival date & time: 07/03/23  1610     History  Chief Complaint  Patient presents with   Knee Pain    Johnny Gentry is a 28 y.o. male.  Who is otherwise healthy presenting for a knee injury.  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  He slipped and fell on a loading ramp twisting his right knee.  Since that time he has been unable to fully extend the right knee and has had difficulty walking.  No other injuries were sustained   Knee Pain      Home Medications Prior to Admission medications   Medication Sig Start Date End Date Taking? Authorizing Provider  acetaminophen  (TYLENOL ) 325 MG tablet Take 2 tablets (650 mg total) by mouth every 6 (six) hours as needed for moderate pain (pain score 4-6). 05/11/23   Christopher Savannah, PA-C  benzonatate  (TESSALON ) 100 MG capsule Take 1 capsule (100 mg total) by mouth every 8 (eight) hours. 06/13/18   Maczis, Sudiksha Victor M, PA-C  cetirizine  (ZYRTEC  ALLERGY) 10 MG tablet Take 1 tablet (10 mg total) by mouth daily. 05/11/23   Christopher Savannah, PA-C  Guaifenesin  (MUCINEX  MAXIMUM STRENGTH) 1200 MG TB12 Take 1 tablet (1,200 mg total) by mouth every 12 (twelve) hours as needed. 06/13/18   Maczis, Haizlee Henton M, PA-C  ibuprofen  (ADVIL ,MOTRIN ) 600 MG tablet Take 1 tablet (600 mg total) by mouth every 6 (six) hours as needed. 06/13/18   Maczis, Amahri Dengel M, PA-C  naproxen  (NAPROSYN ) 500 MG tablet Take 1 tablet (500 mg total) by mouth 2 (two) times daily. Patient not taking: Reported on 04/22/2018 02/23/18   Geiple, Joshua, PA-C  ondansetron  (ZOFRAN  ODT) 4 MG disintegrating tablet Take 1 tablet (4 mg total) by mouth every 8 (eight) hours as needed for nausea or vomiting. Patient not taking: Reported on 04/22/2018 02/04/17   Dansie, William, PA-C  predniSONE  (DELTASONE ) 10 MG tablet Take 3 tablets (30 mg total) by mouth daily with breakfast.  05/11/23   Christopher Savannah, PA-C  promethazine -dextromethorphan (PROMETHAZINE -DM) 6.25-15 MG/5ML syrup Take 5 mLs by mouth 3 (three) times daily as needed for cough. 05/11/23   Christopher Savannah, PA-C      Allergies    Amoxicillin and Penicillins    Review of Systems   Review of Systems  Physical Exam Updated Vital Signs BP 116/73   Pulse 74   Temp 98.3 F (36.8 C)   Resp 16   Ht 5' 9 (1.753 m)   Wt 74.8 kg   SpO2 99%   BMI 24.35 kg/m  Physical Exam Vitals and nursing note reviewed.  HENT:     Head: Normocephalic and atraumatic.  Eyes:     Pupils: Pupils are equal, round, and reactive to light.  Cardiovascular:     Rate and Rhythm: Normal rate and regular rhythm.  Pulmonary:     Effort: Pulmonary effort is normal.     Breath sounds: Normal breath sounds.  Abdominal:     Palpations: Abdomen is soft.     Tenderness: There is no abdominal tenderness.  Musculoskeletal:     Comments: Tenderness over anterior right knee and medial aspect of right knee joint with mild edema No deformity No ligamentous instability with anterior posterior or medial lateral compressive force Sensation tact light touch throughout 2+ DP pulse bilaterally 5 out of 5 motor strength at the hip joint and foot/ankle  Skin:    General: Skin is warm and dry.  Neurological:     Mental Status: He is alert.  Psychiatric:        Mood and Affect: Mood normal.     ED Results / Procedures / Treatments   Labs (all labs ordered are listed, but only abnormal results are displayed) Labs Reviewed - No data to display  EKG None  Radiology DG Knee Complete 4 Views Right Result Date: 07/03/2023 CLINICAL DATA:  Fall at work on Thursday. Knee pain, unable to straighten right leg. EXAM: RIGHT KNEE - COMPLETE 4+ VIEW COMPARISON:  None Available. FINDINGS: No acute fracture. The joint spaces are preserved. No dislocation. Corticated density adjacent to the anterior tibial tubercle has a chronic appearance. No  significant knee joint effusion. No focal bone abnormality. There is mild anterior soft tissue edema. IMPRESSION: Mild anterior soft tissue edema. No acute fracture or subluxation of the right knee. Electronically Signed   By: Andrea Gasman M.D.   On: 07/03/2023 17:31    Procedures Procedures    Medications Ordered in ED Medications - No data to display  ED Course/ Medical Decision Making/ A&P                                 Medical Decision Making 28 year old male presenting for right knee pain status post injury 2 days ago.  Exam notable for medial right knee tenderness with mild edema.  No deformity or significant laxity.  X-ray shows no osseous injury.  Suspect this is ligamentous strain sprain such as ACL or MCL.  Will provide with knee immobilizer, crutches and close orthopedic follow-up.  Symptomatic management with Tylenol  Motrin  discussed.  Return precaution discussed as well           Final Clinical Impression(s) / ED Diagnoses Final diagnoses:  Acute pain of right knee    Rx / DC Orders ED Discharge Orders     None         Pamella Ozell LABOR, DO 07/03/23 1803

## 2023-07-03 NOTE — ED Notes (Signed)
Pt able to demonstrate proper use of crutches

## 2023-07-03 NOTE — ED Triage Notes (Signed)
 The pt injured his rt knee pain Thursday at work at [employer]  he is unable to straighten his rt knee since then  unable to bear weight

## 2023-07-03 NOTE — ED Notes (Signed)
Pt has returned from xray

## 2023-07-03 NOTE — Discharge Instructions (Signed)
 You were seen in the Emergency Department for right knee pain The x-ray did not show any broken bones but did show some swelling around the knee The most likely injury would be a ligament such as ACL or MCL strain or tear For this reason need to follow-up with orthopedics at the number listed above Continue to take Tylenol  Motrin  as directed for pain Use the crutches to get around and keep the knee immobilizer in place fracture support when you are moving Return to the emerged part for severe pain or any other concerns

## 2023-07-03 NOTE — ED Notes (Signed)
 ..  The patient is A&OX4, ambulatory at d/c with independent steady gait, NAD. Pt verbalized understanding of d/c instructions and follow up care.
